# Patient Record
Sex: Male | Born: 1965 | Race: Black or African American | Hispanic: No | Smoking: Current every day smoker
Health system: Southern US, Community
[De-identification: ages and names within clinical notes are randomized; demographics above are authoritative.]

## PROBLEM LIST (undated history)

## (undated) DIAGNOSIS — B019 Varicella without complication: Secondary | ICD-10-CM

## (undated) DIAGNOSIS — T7840XA Allergy, unspecified, initial encounter: Secondary | ICD-10-CM

## (undated) DIAGNOSIS — F32A Depression, unspecified: Secondary | ICD-10-CM

## (undated) DIAGNOSIS — A630 Anogenital (venereal) warts: Secondary | ICD-10-CM

## (undated) DIAGNOSIS — F329 Major depressive disorder, single episode, unspecified: Secondary | ICD-10-CM

## (undated) DIAGNOSIS — K219 Gastro-esophageal reflux disease without esophagitis: Secondary | ICD-10-CM

## (undated) DIAGNOSIS — I10 Essential (primary) hypertension: Secondary | ICD-10-CM

## (undated) HISTORY — DX: Essential (primary) hypertension: I10

## (undated) HISTORY — DX: Anogenital (venereal) warts: A63.0

## (undated) HISTORY — DX: Allergy, unspecified, initial encounter: T78.40XA

## (undated) HISTORY — DX: Major depressive disorder, single episode, unspecified: F32.9

## (undated) HISTORY — DX: Gastro-esophageal reflux disease without esophagitis: K21.9

## (undated) HISTORY — DX: Varicella without complication: B01.9

## (undated) HISTORY — DX: Depression, unspecified: F32.A

---

## 2007-11-11 ENCOUNTER — Ambulatory Visit: Payer: Self-pay | Admitting: Family Medicine

## 2007-11-11 DIAGNOSIS — A630 Anogenital (venereal) warts: Secondary | ICD-10-CM | POA: Insufficient documentation

## 2007-11-11 DIAGNOSIS — J309 Allergic rhinitis, unspecified: Secondary | ICD-10-CM | POA: Insufficient documentation

## 2007-11-11 DIAGNOSIS — F329 Major depressive disorder, single episode, unspecified: Secondary | ICD-10-CM

## 2007-11-11 DIAGNOSIS — B079 Viral wart, unspecified: Secondary | ICD-10-CM | POA: Insufficient documentation

## 2007-11-11 DIAGNOSIS — K219 Gastro-esophageal reflux disease without esophagitis: Secondary | ICD-10-CM

## 2007-11-11 DIAGNOSIS — I10 Essential (primary) hypertension: Secondary | ICD-10-CM | POA: Insufficient documentation

## 2007-12-10 ENCOUNTER — Encounter: Payer: Self-pay | Admitting: Family Medicine

## 2008-05-23 ENCOUNTER — Emergency Department (HOSPITAL_COMMUNITY): Admission: EM | Admit: 2008-05-23 | Discharge: 2008-05-23 | Payer: Self-pay | Admitting: Emergency Medicine

## 2009-07-14 ENCOUNTER — Ambulatory Visit: Payer: Self-pay | Admitting: Family Medicine

## 2009-07-14 DIAGNOSIS — M25569 Pain in unspecified knee: Secondary | ICD-10-CM

## 2009-07-20 ENCOUNTER — Ambulatory Visit (HOSPITAL_COMMUNITY): Admission: RE | Admit: 2009-07-20 | Discharge: 2009-07-20 | Payer: Self-pay | Admitting: Sports Medicine

## 2009-08-24 ENCOUNTER — Ambulatory Visit: Payer: Self-pay | Admitting: Family Medicine

## 2009-08-24 LAB — CONVERTED CEMR LAB
Bilirubin Urine: NEGATIVE
Blood in Urine, dipstick: NEGATIVE
Glucose, Urine, Semiquant: NEGATIVE
Nitrite: NEGATIVE
Urobilinogen, UA: 1
pH: 6

## 2009-08-29 ENCOUNTER — Ambulatory Visit: Payer: Self-pay | Admitting: Family Medicine

## 2009-08-29 DIAGNOSIS — E119 Type 2 diabetes mellitus without complications: Secondary | ICD-10-CM

## 2009-08-29 LAB — CONVERTED CEMR LAB
ALT: 65 units/L — ABNORMAL HIGH (ref 0–53)
Albumin: 4.7 g/dL (ref 3.5–5.2)
Basophils Absolute: 0 10*3/uL (ref 0.0–0.1)
Bilirubin, Direct: 0.2 mg/dL (ref 0.0–0.3)
Calcium: 9.5 mg/dL (ref 8.4–10.5)
Cholesterol: 162 mg/dL (ref 0–200)
Eosinophils Relative: 2.5 % (ref 0.0–5.0)
GFR calc non Af Amer: 114.21 mL/min (ref >60)
HDL: 64.9 mg/dL (ref >39.00)
Hemoglobin: 15.5 g/dL (ref 13.0–17.0)
LDL Cholesterol: 89 mg/dL (ref 0–99)
MCV: 96.2 fL (ref 78.0–100.0)
Monocytes Absolute: 0.7 10*3/uL (ref 0.1–1.0)
Neutrophils Relative %: 66 % (ref 43.0–77.0)
PSA: 0.56 ng/mL (ref 0.10–4.00)
Potassium: 4.9 meq/L (ref 3.5–5.1)
RBC: 4.74 M/uL (ref 4.22–5.81)
RDW: 12.9 % (ref 11.5–14.6)
Triglycerides: 40 mg/dL (ref 0.0–149.0)

## 2010-04-10 NOTE — Assessment & Plan Note (Signed)
Summary: CPX//ALP   Vital Signs:  Patient profile:   45 year old male Weight:      208 pounds BMI:     28.31 BP sitting:   148 / 90  (left arm) Cuff size:   regular  Vitals Entered By: Raechel Ache, RN (August 29, 2009 3:04 PM) CC: CPX, labs done. C/o R knee pain.   History of Present Illness: 45 yr old male for a cpx. He feels good except for right knee pain. He has been seeing Dr. 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 They are planning an arthroscopy soon.   Allergies (verified): No Known Drug Allergies  Past History:  Past Medical History: Reviewed history from 11/11/2007 and no changes required. Depression GERD Genital Warts Allergic rhinitis Hypertension chickenpox  Past Surgical History: Reviewed history from 11/11/2007 and no changes required. Denies surgical history  Family History: Reviewed history from 11/11/2007 and no changes required. Family History of Alcoholism/Addiction Family History of Arthritis Family History Hypertension Family History of Stroke M 1st degree relative <50  Social History: Reviewed history from 11/11/2007 and no changes required. Divorced Current Smoker Alcohol use-yes Drug use-no Regular exercise-yes  Review of Systems  The patient denies anorexia, fever, weight loss, weight gain, vision loss, decreased hearing, hoarseness, chest pain, syncope, dyspnea on exertion, peripheral edema, prolonged cough, headaches, hemoptysis, abdominal pain, melena, hematochezia, severe indigestion/heartburn, hematuria, incontinence, genital sores, muscle weakness, suspicious skin lesions, transient blindness, difficulty walking, depression, unusual weight change, abnormal bleeding, enlarged lymph nodes, angioedema, breast masses, and testicular masses.    Physical Exam  General:  Well-developed,well-nourished,in no acute distress; alert,appropriate and cooperative throughout examination Head:   Normocephalic and atraumatic without obvious abnormalities. No apparent alopecia or balding. Eyes:  No corneal or conjunctival inflammation noted. EOMI. Perrla. Funduscopic exam benign, without hemorrhages, exudates or papilledema. Vision grossly normal. Ears:  External ear exam shows no significant lesions or deformities.  Otoscopic examination reveals clear canals, tympanic membranes are intact bilaterally without bulging, retraction, inflammation or discharge. Hearing is grossly normal bilaterally. Nose:  External nasal examination shows no deformity or inflammation. Nasal mucosa are pink and moist without lesions or exudates. Mouth:  Oral mucosa and oropharynx without lesions or exudates.  Teeth in good repair. Neck:  No deformities, masses, or tenderness noted. Chest Wall:  No deformities, masses, tenderness or gynecomastia noted. Lungs:  Normal respiratory effort, chest expands symmetrically. Lungs are clear to auscultation, no crackles or wheezes. Heart:  Normal rate and regular rhythm. S1 and S2 normal without gallop, murmur, click, rub or other extra sounds. Abdomen:  Bowel sounds positive,abdomen soft and non-tender without masses, organomegaly or hernias noted. Rectal:  No external abnormalities noted. Normal sphincter tone. No rectal masses or tenderness. Genitalia:  Testes bilaterally descended without nodularity, tenderness or masses. No scrotal masses or lesions. No penis lesions or urethral discharge. Prostate:  Prostate gland firm and smooth, no enlargement, nodularity, tenderness, mass, asymmetry or induration. Msk:  No deformity or scoliosis noted of thoracic or lumbar spine.   Pulses:  R and L carotid,radial,femoral,dorsalis pedis and posterior tibial pulses are full and equal bilaterally Extremities:  No clubbing, cyanosis, edema, or deformity noted with normal full range of motion of all joints.   Neurologic:  No cranial nerve deficits noted. Station and gait are normal. Plantar  reflexes are down-going bilaterally. DTRs are symmetrical throughout. Sensory, motor and coordinative functions appear intact. Skin:  Intact without suspicious  lesions or rashes Cervical Nodes:  No lymphadenopathy noted Axillary Nodes:  No palpable lymphadenopathy Inguinal Nodes:  No significant adenopathy Psych:  Cognition and judgment appear intact. Alert and cooperative with normal attention span and concentration. No apparent delusions, illusions, hallucinations   Impression & Recommendations:  Problem # 1:  HEALTH MAINTENANCE EXAM (ICD-V70.0)  Problem # 2:  DIABETES MELLITUS, TYPE II (ICD-250.00)  His updated medication list for this problem includes:    Losartan Potassium-hctz 50-12.5 Mg Tabs (Losartan potassium-hctz) ..... Once daily  Problem # 3:  HYPERTENSION (ICD-401.9)  His updated medication list for this problem includes:    Losartan Potassium-hctz 50-12.5 Mg Tabs (Losartan potassium-hctz) ..... Once daily  Complete Medication List: 1)  Losartan Potassium-hctz 50-12.5 Mg Tabs (Losartan potassium-hctz) .... Once daily  Patient Instructions: 1)  We spent 30 minutes beyond the cpx discussing HTN and DM. I stressed reducing sodium intake and exercise for the BP, and we will start on Losartan HCT. As for diabetes, we will try diet and exercise alone for now. He will reduce his carbohydrate intake, and we will recheck both in 4 weeks.  Prescriptions: LOSARTAN POTASSIUM-HCTZ 50-12.5 MG TABS (LOSARTAN POTASSIUM-HCTZ) once daily  #30 x 11   Entered and Authorized by:   Nelwyn Salisbury MD   Signed by:   Nelwyn Salisbury MD on 08/29/2009   Method used:   Electronically to        [facility] [phone]* (retail)       [address]       [place], [place]  [address]       Ph: [phone]       Fax: [phone]   RxID:   [id number]

## 2010-04-10 NOTE — Assessment & Plan Note (Signed)
Summary: consult re: Knee Pain for over a month/cjr   Vital Signs:  Patient profile:   45 year old male Weight:      202 pounds BMI:     27.50 BP sitting:   150 / 104  (left arm) Cuff size:   regular  Vitals Entered By: Raechel Ache, RN (Jul 14, 2009 11:18 AM) CC: C/o R knee pain x 2 weeks.   History of Present Illness: Here for chronic right knee pain. He originally hurt the knee in high school playing football. This seemed to get better at the time, but over the years he has periodically had trouble with it. he usually gets pain and swellin gin the medial side of the knee for seversal days, then it goes away. However it has bothered him continuously for the past 2 weeks. He cannot sleep due ti the pain, and it does not respond to Ibuprofen. No locking or giving way.   Allergies (verified): No Known Drug Allergies  Past History:  Past Medical History: Reviewed history from 11/11/2007 and no changes required. Depression GERD Genital Warts Allergic rhinitis Hypertension chickenpox  Past Surgical History: Reviewed history from 11/11/2007 and no changes required. Denies surgical history  Review of Systems  The patient denies anorexia, fever, weight loss, weight gain, vision loss, decreased hearing, hoarseness, chest pain, syncope, dyspnea on exertion, peripheral edema, prolonged cough, headaches, hemoptysis, abdominal pain, melena, hematochezia, severe indigestion/heartburn, hematuria, incontinence, genital sores, muscle weakness, suspicious skin lesions, transient blindness, difficulty walking, depression, unusual weight change, abnormal bleeding, enlarged lymph nodes, angioedema, breast masses, and testicular masses.    Physical Exam  General:  walks with a limp Msk:  the right knee is tender in the medial joint space. Full ROM although flexion is painful. No swelling or crepitus. Negative McMurrays and negative anterior drawer   Impression & Recommendations:  Problem  # 1:  KNEE PAIN (ICD-719.46)  His updated medication list for this problem includes:    Vicodin Hp 10-660 Mg Tabs (Hydrocodone-acetaminophen) .Marland Kitchen... 1 q 6 hours as needed pain  Orders: Orthopedic Referral (Ortho)  Complete Medication List: 1)  Vicodin Hp 10-660 Mg Tabs (Hydrocodone-acetaminophen) .Marland Kitchen.. 1 q 6 hours as needed pain  Patient Instructions: 1)  I think it likely that he has torn some cartilage in the knee, so we will refer to Orthopedics. Prescriptions: VICODIN HP 10-660 MG TABS (HYDROCODONE-ACETAMINOPHEN) 1 q 6 hours as needed pain  #30 x 0   Entered and Authorized by:   Nelwyn Salisbury MD   Signed by:   Nelwyn Salisbury MD on 07/14/2009   Method used:   Print then Give to Patient   RxID:   [id number]

## 2010-06-26 ENCOUNTER — Encounter: Payer: Self-pay | Admitting: Family Medicine

## 2010-06-26 ENCOUNTER — Ambulatory Visit: Payer: Self-pay | Admitting: Family Medicine

## 2010-06-27 ENCOUNTER — Ambulatory Visit (INDEPENDENT_AMBULATORY_CARE_PROVIDER_SITE_OTHER): Payer: Managed Care, Other (non HMO) | Admitting: Family Medicine

## 2010-06-27 ENCOUNTER — Encounter: Payer: Self-pay | Admitting: Family Medicine

## 2010-06-27 VITALS — BP 130/100 | HR 68 | Temp 98.3°F | Wt 219.0 lb

## 2010-06-27 DIAGNOSIS — G5603 Carpal tunnel syndrome, bilateral upper limbs: Secondary | ICD-10-CM

## 2010-06-27 DIAGNOSIS — M7710 Lateral epicondylitis, unspecified elbow: Secondary | ICD-10-CM

## 2010-06-27 DIAGNOSIS — M7552 Bursitis of left shoulder: Secondary | ICD-10-CM

## 2010-06-27 DIAGNOSIS — G5600 Carpal tunnel syndrome, unspecified upper limb: Secondary | ICD-10-CM

## 2010-06-27 DIAGNOSIS — M67919 Unspecified disorder of synovium and tendon, unspecified shoulder: Secondary | ICD-10-CM

## 2010-06-27 DIAGNOSIS — M719 Bursopathy, unspecified: Secondary | ICD-10-CM

## 2010-06-27 DIAGNOSIS — I10 Essential (primary) hypertension: Secondary | ICD-10-CM

## 2010-06-27 MED ORDER — LOSARTAN POTASSIUM-HCTZ 50-12.5 MG PO TABS: 1.0000 | ORAL_TABLET | Freq: Every day | ORAL | Status: DC

## 2010-06-27 MED ORDER — ETODOLAC 500 MG PO TABS: 500.0000 mg | ORAL_TABLET | Freq: Two times a day (BID) | ORAL | Status: AC

## 2010-06-27 NOTE — Progress Notes (Signed)
  Subjective:    Patient ID: Maurice Sweeney, male    DOB: 30-Sep-1965, 45 y.o.   MRN: [id number]  HPI Here for several different pains. For the past 3 months he has had occasional numbness and tingling and weakness in both hands and forearms, the right worse than the left. Now he occasionally gets pain in the right hand also. Second, he describes pains in both elbows which gets worse the more he uses the. Third he has had pain in the left shoulder for a month or so. No trauma hx, but his job is quite physical. 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 He admits to not taking his BP med for several months because he "forgot".    Review of Systems  Musculoskeletal: Positive for arthralgias. Negative for back pain.  Neurological: Positive for weakness and numbness.       Objective:   Physical Exam  Constitutional: He appears well-developed and well-nourished.  Cardiovascular: Normal rate, regular rhythm, normal heart sounds and intact distal pulses.   Pulmonary/Chest: Effort normal and breath sounds normal.  Musculoskeletal:       He has full ROM of the neck and spine, no neck tenderness. He is very tender over both lateral epicondyles at the elbows. He is tender over the right forearm and wrist. No swelling.   Neurological: He has normal reflexes. He exhibits normal muscle tone.          Assessment & Plan:  He has 3 different overuse syndromes going on. I wrote a note for him to avoid heavy lifting at work for the next 2 weeks. He is to avoid any weight lifting or yard work on his spare time for now. Rest, ice packs, Etodolac for inflammation. Get back on HTN meds.

## 2010-07-23 ENCOUNTER — Encounter: Payer: Self-pay | Admitting: Family Medicine

## 2010-07-23 ENCOUNTER — Ambulatory Visit (INDEPENDENT_AMBULATORY_CARE_PROVIDER_SITE_OTHER): Payer: Managed Care, Other (non HMO) | Admitting: Family Medicine

## 2010-07-23 VITALS — BP 130/100 | Temp 98.5°F | Ht 72.0 in | Wt 221.0 lb

## 2010-07-23 DIAGNOSIS — M79609 Pain in unspecified limb: Secondary | ICD-10-CM

## 2010-07-23 DIAGNOSIS — M79643 Pain in unspecified hand: Secondary | ICD-10-CM

## 2010-07-23 DIAGNOSIS — M79603 Pain in arm, unspecified: Secondary | ICD-10-CM

## 2010-07-23 NOTE — Progress Notes (Signed)
  Subjective:    Patient ID: Maurice Sweeney, male    DOB: [birth year], 45 y.o.   MRN: [id number]  HPI Her with continued problems of intermittent pain and numbness and weakness in both arms from the shoulders down to the hands. This has go on for the past 4 months. No hx of trauma. No neck or back pain. He has been taking Etodolac regularly with no improvement.    Review of Systems  Musculoskeletal: Positive for myalgias. Negative for back pain and arthralgias.  Neurological: Positive for weakness and numbness.       Objective:   Physical Exam  Constitutional: He is oriented to person, place, and time. He appears well-developed and well-nourished.  Neck: Normal range of motion. Neck supple.       No tenderness or spasm  Musculoskeletal: Normal range of motion. He exhibits no edema.       He has pain when shaking either hand.   Neurological: He is alert and oriented to person, place, and time. He displays normal reflexes. No cranial nerve deficit. He exhibits normal muscle tone. Coordination normal.          Assessment & Plan:  This is either due to carpal tunnel syndrome or to pinched nerves in the neck, most likely the former. Suggested he wear wrist braces as much as possible. Set up NCSs soon with EMGs

## 2010-08-17 ENCOUNTER — Telehealth: Payer: Self-pay | Admitting: Family Medicine

## 2010-08-17 NOTE — Telephone Encounter (Signed)
Tell him his nerve conduction study confirms what I thought, that he has carpal tunnel syndrome in the right hand. Ask him if wearing the wrist braces has helped. If so, keep doing this. If not, I will refer him to a hand surgeon to consider surgery

## 2010-08-17 NOTE — Telephone Encounter (Signed)
Left message on machine for patient to return our call 

## 2010-08-20 ENCOUNTER — Telehealth: Payer: Self-pay | Admitting: *Deleted

## 2010-08-20 DIAGNOSIS — G5600 Carpal tunnel syndrome, unspecified upper limb: Secondary | ICD-10-CM

## 2010-08-20 NOTE — Telephone Encounter (Signed)
Tell him his nerve conduction study confirms what I thought, that he has carpal tunnel syndrome in the right hand. Ask him if wearing the wrist braces has helped. If so, keep doing this. If not, I will refer him to a hand surgeon to consider surgery   Left message on machine to call back.

## 2010-08-20 NOTE — Telephone Encounter (Signed)
Pt aware of results and the brace is not helping. Order sent to [facility] for hand surgery referral.

## 2010-08-23 ENCOUNTER — Encounter: Payer: Self-pay | Admitting: Family Medicine

## 2010-10-05 ENCOUNTER — Encounter (HOSPITAL_BASED_OUTPATIENT_CLINIC_OR_DEPARTMENT_OTHER)
Admission: RE | Admit: 2010-10-05 | Discharge: 2010-10-05 | Disposition: A | Discharge status: Home / Self Care | Payer: Managed Care, Other (non HMO) | Source: Ambulatory Visit | Attending: Orthopedic Surgery | Admitting: Orthopedic Surgery

## 2010-10-05 LAB — BASIC METABOLIC PANEL
BUN: 16 mg/dL (ref 6–23)
Calcium: 9.7 mg/dL (ref 8.4–10.5)
Chloride: 104 mEq/L (ref 96–112)
Potassium: 4.4 mEq/L (ref 3.5–5.1)
Sodium: 137 mEq/L (ref 135–145)

## 2010-10-09 ENCOUNTER — Ambulatory Visit (HOSPITAL_BASED_OUTPATIENT_CLINIC_OR_DEPARTMENT_OTHER)
Admission: RE | Admit: 2010-10-09 | Discharge: 2010-10-09 | Disposition: A | Discharge status: Home / Self Care | Payer: Managed Care, Other (non HMO) | Source: Ambulatory Visit | Attending: Orthopedic Surgery | Admitting: Orthopedic Surgery

## 2010-10-09 DIAGNOSIS — G5600 Carpal tunnel syndrome, unspecified upper limb: Secondary | ICD-10-CM | POA: Insufficient documentation

## 2010-10-09 DIAGNOSIS — Z01810 Encounter for preprocedural cardiovascular examination: Secondary | ICD-10-CM | POA: Insufficient documentation

## 2010-10-09 DIAGNOSIS — Z01812 Encounter for preprocedural laboratory examination: Secondary | ICD-10-CM | POA: Insufficient documentation

## 2010-12-11 NOTE — Op Note (Signed)
  NAME:  Maurice Sweeney, Maurice Sweeney                ACCOUNT NO.:  [id number]  MEDICAL RECORD NO.:  [id number]  LOCATION:                                 FACILITY:  PHYSICIAN:  Cindee Salt, M.D.       DATE OF BIRTH:  [birth year]  DATE OF PROCEDURE:  10/09/2010 DATE OF DISCHARGE:                              OPERATIVE REPORT   PREOPERATIVE DIAGNOSIS:  Carpal tunnel syndrome, right hand.  POSTOPERATIVE DIAGNOSIS:  Carpal tunnel syndrome, right hand.  OPERATION:  Decompression of right median nerve.  SURGEON:  Cindee Salt, MD  ANESTHESIA:  General with local infiltration.  ANESTHESIOLOGIST:  Janetta Hora. Gelene Mink, MD  HISTORY:  The patient is a 45 year old male with a history of carpal tunnel syndrome, EMG nerve conductions positive.  This has not responded to conservative treatment.  He has elected to undergo surgical decompression.  Pre, peri, and postoperative course have been discussed along with risks and complications.  He is aware there is no guaranteewith the surgery, possibility of infection, recurrence, injury to arteries, nerves, tendons, complete relief of symptoms, dystrophy.  In the preoperative area, the patient was seen, the extremity was marked by both the patient and surgeon, antibiotic given.  PROCEDURE:  The patient was brought to the operating room.  Where general anesthetic was carried out without difficulty.  He was prepped using ChloraPrep in supine position, right arm free.  A 3-minute dry time was allowed.  Time-out taken confirming the patient and the procedure.  The limb was exsanguinated with an Esmarch bandage. Tourniquet was placed on forearm and was inflated to 220 mmHg.  A straight incision was made longitudinally on the palm, carried down through subcutaneous tissue.  Bleeders were electrocauterized, palmar fascia was split, superficial palmar arch identified, the flexor tendon to the ring and little fingers was identified.  To the ulnar side of median nerve,  the carpal retinaculum was incised with sharp dissection. Right angle and Sewall retractor were placed between skin and forearm fascia.  The fascia was released for approximately 1.5 proximal to the wrist crease under direct vision.  The area of compression to the nerve with the nerve hyperemia was immediately apparent.  No further lesions were identified.  Tenosynovium tissue was markedly thickened.  The wound was irrigated.  The skin was closed with interrupted 4-0 Vicryl Rapide sutures.  A local infiltration with 0.25% Marcaine without epinephrine was given.  A sterile compressive dressing was applied and fingers were free.  On deflation of the tourniquet, all fingers immediately pinked.  He was taken to the recovery room for observation in satisfactory condition.  He will be discharged home to return to the [facility] [place] in 1 week on Vicodin.          ______________________________ Cindee Salt, M.D.     GK/MEDQ  D:  10/09/2010  T:  10/09/2010  Job:  [id number]  cc:   Jeannett Senior A. Clent Ridges, MD  Electronically Signed by Cindee Salt M.D. on 12/11/2010 04:36:53 PM

## 2011-02-06 IMAGING — CR DG ORBITS FOR FOREIGN BODY
2 series · 2 of 2 positions shown · non-contrast
Comparison: None.

CLINICAL DATA: MRI screening.  History of working with metal.

ORBITS FOR FOREIGN BODY - 2 VIEW

[view not recorded (1 of 2)]
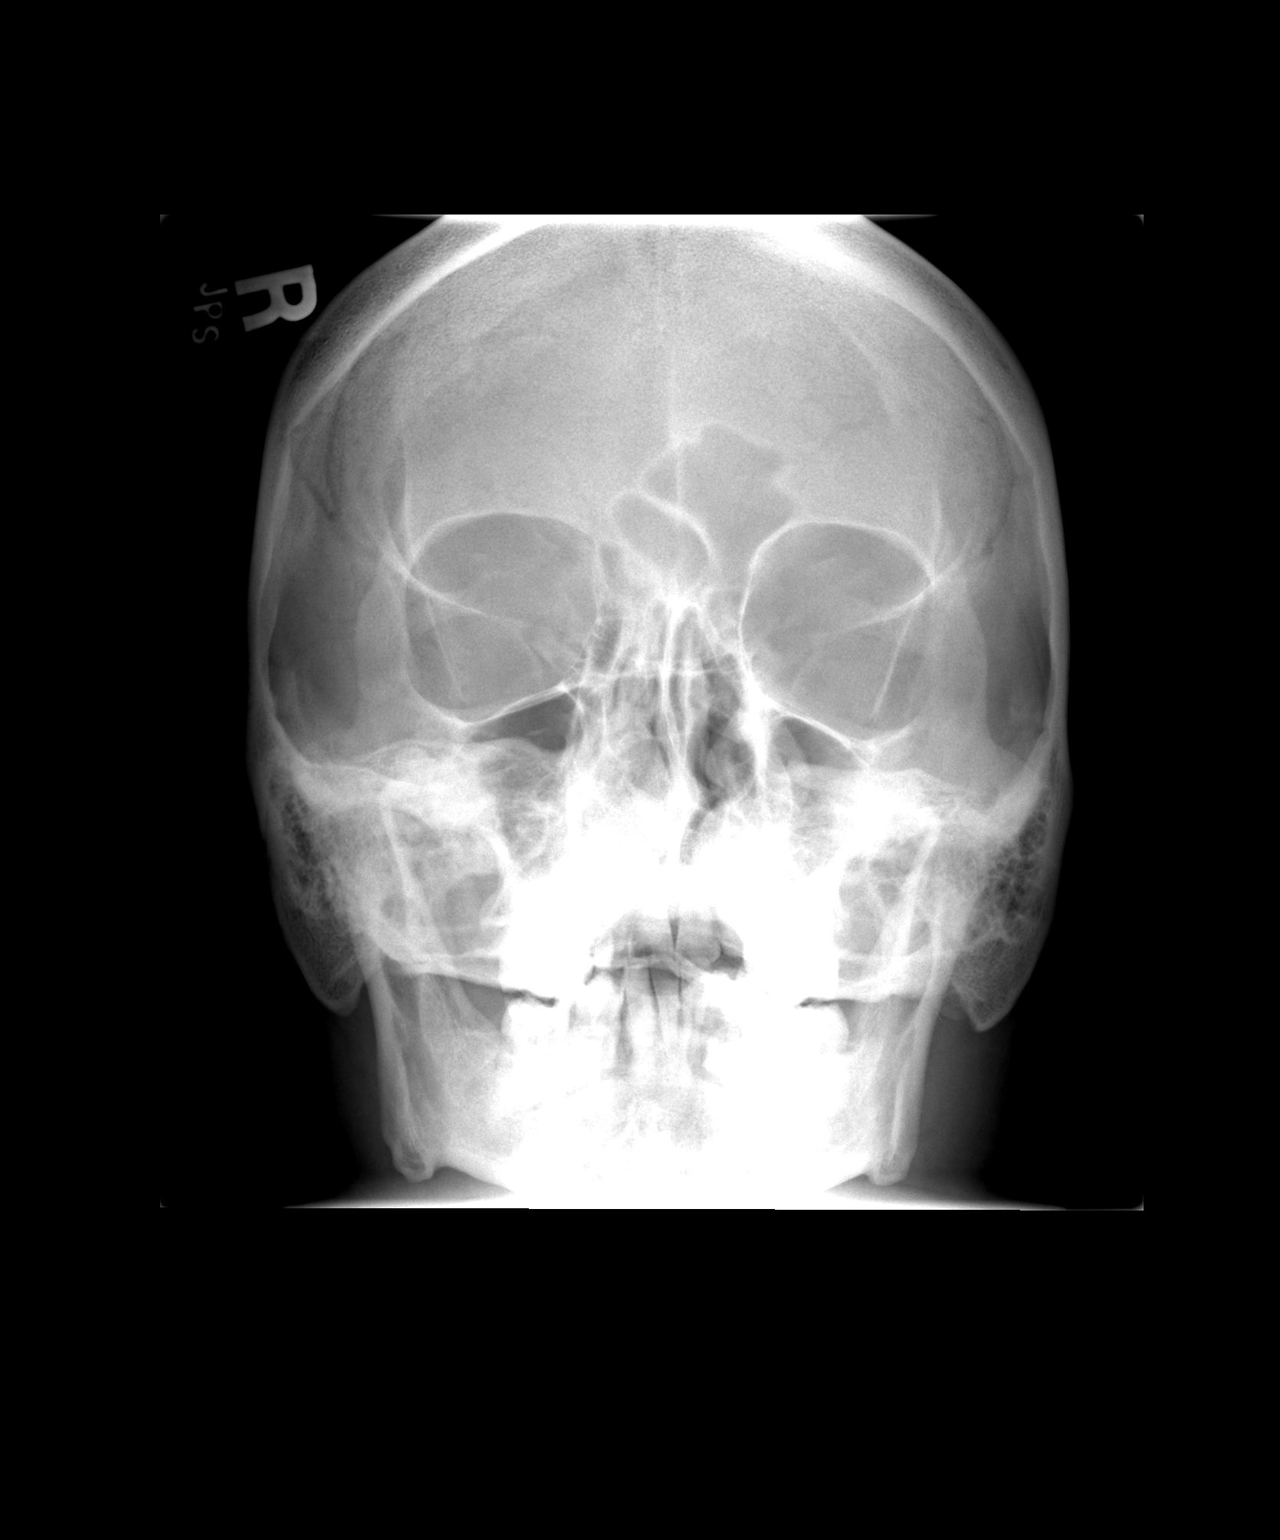

[view not recorded (2 of 2)]
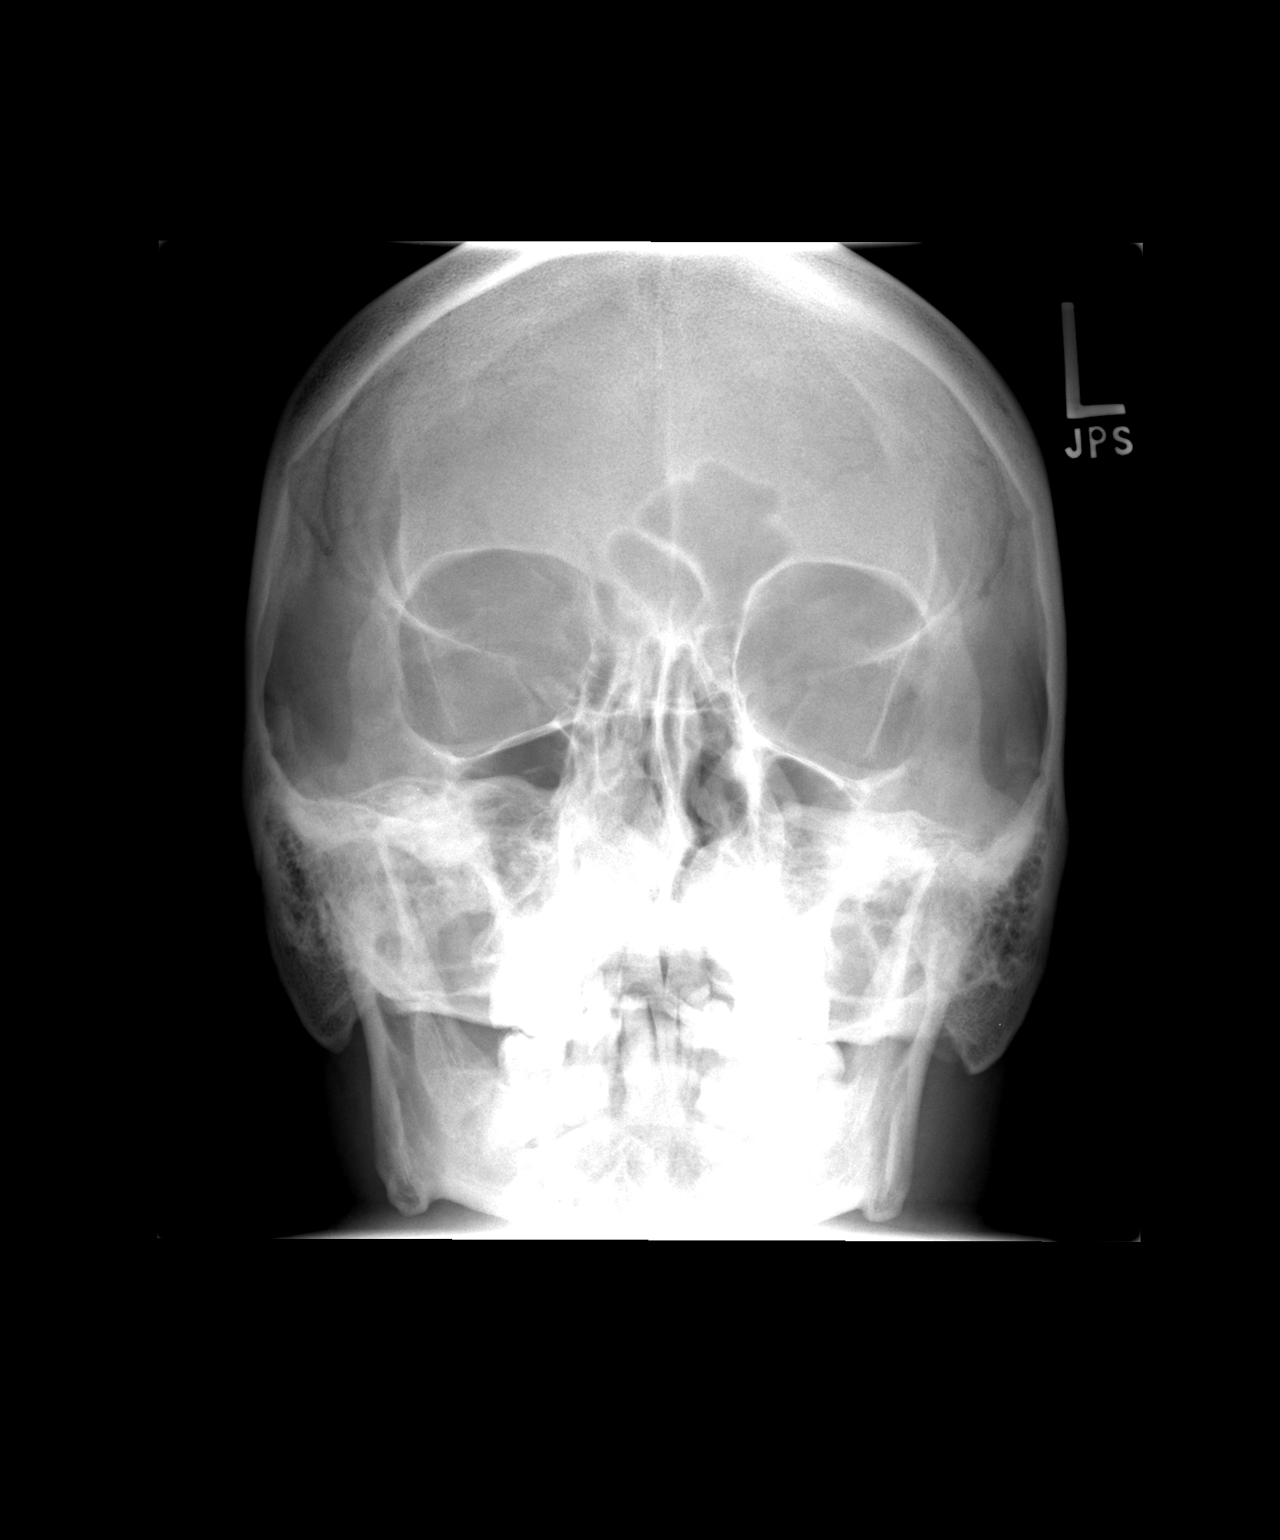

[2 of 2 positions shown; findings below may reference images not displayed]

FINDINGS: No metallic orbital foreign body is appreciated.
IMPRESSION: No evidence for metallic orbital foreign material.

## 2012-05-12 ENCOUNTER — Encounter: Payer: Self-pay | Admitting: Family Medicine

## 2012-05-12 ENCOUNTER — Ambulatory Visit (INDEPENDENT_AMBULATORY_CARE_PROVIDER_SITE_OTHER): Payer: Managed Care, Other (non HMO) | Admitting: Family Medicine

## 2012-05-12 VITALS — BP 164/108 | HR 82 | Temp 98.5°F

## 2012-05-12 DIAGNOSIS — I10 Essential (primary) hypertension: Secondary | ICD-10-CM

## 2012-05-12 DIAGNOSIS — N529 Male erectile dysfunction, unspecified: Secondary | ICD-10-CM

## 2012-05-12 LAB — HEPATIC FUNCTION PANEL
Bilirubin, Direct: 0.1 mg/dL (ref 0.0–0.3)
Total Bilirubin: 0.7 mg/dL (ref 0.3–1.2)

## 2012-05-12 LAB — CBC WITH DIFFERENTIAL/PLATELET
Eosinophils Absolute: 0.2 10*3/uL (ref 0.0–0.7)
Hemoglobin: 15.4 g/dL (ref 13.0–17.0)
Monocytes Relative: 7.1 % (ref 3.0–12.0)
Neutro Abs: 4.9 10*3/uL (ref 1.4–7.7)
RBC: 5 Mil/uL (ref 4.22–5.81)

## 2012-05-12 LAB — BASIC METABOLIC PANEL
CO2: 29 mEq/L (ref 19–32)
Creatinine, Ser: 0.9 mg/dL (ref 0.4–1.5)
GFR: 123.45 mL/min (ref >60.00)
Sodium: 139 mEq/L (ref 135–145)

## 2012-05-12 LAB — TSH: TSH: 0.63 u[IU]/mL (ref 0.35–5.50)

## 2012-05-12 MED ORDER — SILDENAFIL CITRATE 50 MG PO TABS: 100.0000 mg | ORAL_TABLET | Freq: Every day | ORAL | Status: DC | PRN

## 2012-05-12 MED ORDER — LOSARTAN POTASSIUM-HCTZ 100-25 MG PO TABS: 1.0000 | ORAL_TABLET | Freq: Every day | ORAL | Status: DC

## 2012-05-12 NOTE — Progress Notes (Signed)
  Subjective:    Patient ID: Maurice Sweeney, male    DOB: 1965-07-17, 47 y.o.   MRN: [id number]  HPI Here to discuss several issues. We have not seen him for 2 years because he had lost his medical insurance for awhile. Now he has it back. He stopped taking Hyzaar about a year ago, and of course his BP has been high. He denies any HAs or chest pain or SOB. Also he has developed some erection problems in the past year. His desire levels are still good. He denies any mood problems.    Review of Systems  Constitutional: Negative.   Respiratory: Negative.   Cardiovascular: Negative.   Neurological: Negative.   Psychiatric/Behavioral: Negative.        Objective:   Physical Exam  Constitutional: He appears well-developed and well-nourished.  Neck: Neck supple. No thyromegaly present.  Cardiovascular: Normal rate, regular rhythm, normal heart sounds and intact distal pulses.   Pulmonary/Chest: Effort normal and breath sounds normal.  Lymphadenopathy:    He has no cervical adenopathy.          Assessment & Plan:  We will treat the HTN with Hyzaar again but will increase the dose to 100/25 daily. His ED probably has several etiologies including his uncontrolled BP and his smoking. He still smokes 1/2 ppd. I strongly urged him to quit. We will get labs to day including a testosterone level  to rule out metabolic issues. Gave him samples of Viagra to try. He will return in one month to reassess these issues and for a cpx.

## 2012-05-13 LAB — POCT URINALYSIS DIPSTICK
Bilirubin, UA: NEGATIVE
Blood, UA: NEGATIVE
Ketones, UA: NEGATIVE
Leukocytes, UA: NEGATIVE
Protein, UA: NEGATIVE
Spec Grav, UA: 1.02
pH, UA: 6.5

## 2012-05-18 MED ORDER — TESTOSTERONE 20.25 MG/1.25GM (1.62%) TD GEL: 4.0000 "application " | Freq: Every day | TRANSDERMAL | Status: DC

## 2012-05-18 NOTE — Addendum Note (Signed)
Addended by: Aniceto Boss A on: 05/18/2012 10:18 AM   Modules accepted: Orders

## 2012-05-18 NOTE — Progress Notes (Signed)
Quick Note:  I left voice message for pt with results, called in script to [facility] and put a copy of results in mail to pt. ______

## 2012-05-18 NOTE — Progress Notes (Signed)
Quick Note:  I was going to put in mail, however there was no street address number, on the name of street. ______

## 2012-06-30 ENCOUNTER — Encounter: Payer: Self-pay | Admitting: Family Medicine

## 2012-06-30 ENCOUNTER — Ambulatory Visit (INDEPENDENT_AMBULATORY_CARE_PROVIDER_SITE_OTHER): Payer: Managed Care, Other (non HMO) | Admitting: Family Medicine

## 2012-06-30 VITALS — BP 146/88 | HR 67 | Temp 98.0°F | Ht 71.25 in | Wt 204.0 lb

## 2012-06-30 DIAGNOSIS — Z0000 Encounter for general adult medical examination without abnormal findings: Secondary | ICD-10-CM

## 2012-06-30 MED ORDER — VERAPAMIL HCL ER 120 MG PO TBCR: 120.0000 mg | EXTENDED_RELEASE_TABLET | Freq: Every day | ORAL | Status: DC

## 2012-06-30 MED ORDER — SILDENAFIL CITRATE 100 MG PO TABS: 100.0000 mg | ORAL_TABLET | Freq: Every day | ORAL | Status: DC | PRN

## 2012-06-30 NOTE — Progress Notes (Signed)
Quick Note:  I left voice message with results. ______ 

## 2012-06-30 NOTE — Progress Notes (Signed)
  Subjective:    Patient ID: Maurice Sweeney, male    DOB: 14-Jun-1965, 47 y.o.   MRN: [id number]  HPI 47 yr old male for a cpx. He has one complaint today. For several years he has had pain in the tips of his fingers whenever his hands get cold. He works in [employer] at times on his job, and wearing gloves does not help. Then when his hands warm up the pain goes away. The fingers do not change colors. He had a borderline low testosterone level but he cannot afford to buy any replacement products. He tried Viagra and it helps.    Review of Systems  Constitutional: Negative.   HENT: Negative.   Eyes: Negative.   Respiratory: Negative.   Cardiovascular: Negative.   Gastrointestinal: Negative.   Genitourinary: Negative.   Musculoskeletal: Negative.   Skin: Negative.   Neurological: Negative.   Psychiatric/Behavioral: Negative.        Objective:   Physical Exam  Constitutional: He is oriented to person, place, and time. He appears well-developed and well-nourished. No distress.  HENT:  Head: Normocephalic and atraumatic.  Right Ear: External ear normal.  Left Ear: External ear normal.  Nose: Nose normal.  Mouth/Throat: Oropharynx is clear and moist. No oropharyngeal exudate.  Eyes: Conjunctivae and EOM are normal. Pupils are equal, round, and reactive to light. Right eye exhibits no discharge. Left eye exhibits no discharge. No scleral icterus.  Neck: Neck supple. No JVD present. No tracheal deviation present. No thyromegaly present.  Cardiovascular: Normal rate, regular rhythm, normal heart sounds and intact distal pulses.  Exam reveals no gallop and no friction rub.   No murmur heard. Pulmonary/Chest: Effort normal and breath sounds normal. No respiratory distress. He has no wheezes. He has no rales. He exhibits no tenderness.  Abdominal: Soft. Bowel sounds are normal. He exhibits no distension and no mass. There is no tenderness. There is no rebound and no guarding.  Genitourinary:  Rectum normal, prostate normal and penis normal. Guaiac negative stool. No penile tenderness.  Musculoskeletal: Normal range of motion. He exhibits no edema and no tenderness.  Lymphadenopathy:    He has no cervical adenopathy.  Neurological: He is alert and oriented to person, place, and time. He has normal reflexes. No cranial nerve deficit. He exhibits normal muscle tone. Coordination normal.  Skin: Skin is warm and dry. No rash noted. He is not diaphoretic. No erythema. No pallor.  Psychiatric: He has a normal mood and affect. His behavior is normal. Judgment and thought content normal.          Assessment & Plan:  Well exam. Get a PSA level. He will use Viagra prn. 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 Recheck in one month.

## 2012-07-13 ENCOUNTER — Ambulatory Visit (INDEPENDENT_AMBULATORY_CARE_PROVIDER_SITE_OTHER): Payer: Managed Care, Other (non HMO) | Admitting: Family Medicine

## 2012-07-13 ENCOUNTER — Encounter: Payer: Self-pay | Admitting: Family Medicine

## 2012-07-13 DIAGNOSIS — L03019 Cellulitis of unspecified finger: Secondary | ICD-10-CM

## 2012-07-13 DIAGNOSIS — L02519 Cutaneous abscess of unspecified hand: Secondary | ICD-10-CM

## 2012-07-13 MED ORDER — CEPHALEXIN 500 MG PO CAPS: 500.0000 mg | ORAL_CAPSULE | Freq: Four times a day (QID) | ORAL | Status: DC

## 2012-07-13 NOTE — Progress Notes (Signed)
  Subjective:    Patient ID: Maurice Sweeney, male    DOB: [birth year], 47 y.o.   MRN: [id number]  HPI Here for 3 days of swelling and pain around the tip of the right third finger. No recent trauma. He had to leave work today because he cannot grip his tools safely. He had been using a hammer to put down wooden floors.    Review of Systems  Constitutional: Negative.        Objective:   Physical Exam  Constitutional: He appears well-developed and well-nourished. No distress.  Musculoskeletal: Normal range of motion. He exhibits no edema.  The distal third of the right third finger is swollen and quite tender. Not red or warm. No breaks in the skin.   Skin: No rash noted. No erythema.          Assessment & Plan:  Use ice packs and Keflex. Out of work today and tomorrow .

## 2012-07-29 ENCOUNTER — Ambulatory Visit: Payer: Managed Care, Other (non HMO) | Admitting: Family Medicine

## 2012-12-21 ENCOUNTER — Ambulatory Visit (INDEPENDENT_AMBULATORY_CARE_PROVIDER_SITE_OTHER): Payer: Managed Care, Other (non HMO) | Admitting: Family Medicine

## 2012-12-21 ENCOUNTER — Encounter: Payer: Self-pay | Admitting: Family Medicine

## 2012-12-21 VITALS — BP 140/80 | HR 67 | Temp 97.9°F | Wt 207.0 lb

## 2012-12-21 DIAGNOSIS — N410 Acute prostatitis: Secondary | ICD-10-CM

## 2012-12-21 LAB — POCT URINALYSIS DIPSTICK
Bilirubin, UA: NEGATIVE
Ketones, UA: NEGATIVE
Leukocytes, UA: NEGATIVE
Nitrite, UA: NEGATIVE
Spec Grav, UA: >=1.03

## 2012-12-21 MED ORDER — CIPROFLOXACIN HCL 500 MG PO TABS: 500.0000 mg | ORAL_TABLET | Freq: Two times a day (BID) | ORAL | Status: DC

## 2012-12-21 NOTE — Progress Notes (Signed)
  Subjective:    Patient ID: Maurice Sweeney, male    DOB: 1965/10/22, 47 y.o.   MRN: [id number]  HPI Here for 8 days of LLQ and suprapubic pains. He has also had increased urgency to urinate but he has a slow stream and he feels like he is not emptying his bladder. He has been constipated but has had some BMs after taking MOM. His last BM was this am. No fever or nausea.    Review of Systems  Constitutional: Negative.   Gastrointestinal: Positive for abdominal pain and constipation. Negative for nausea, vomiting, diarrhea, blood in stool, abdominal distention, anal bleeding and rectal pain.  Genitourinary: Positive for frequency and difficulty urinating. Negative for dysuria, hematuria, flank pain and testicular pain.       Objective:   Physical Exam  Constitutional: He appears well-developed and well-nourished. No distress.  Abdominal: Soft. Bowel sounds are normal. He exhibits no distension and no mass. There is no rebound and no guarding.  Tender in the LLQ and above the pubis  Genitourinary:  The prostate is mildly swollen and extremely tender           Assessment & Plan:  Drink lots of water. Take Cipro for 14 days. Out of work today and tomorrow

## 2012-12-21 NOTE — Addendum Note (Signed)
Addended by: Aniceto Boss A on: 12/21/2012 12:12 PM   Modules accepted: Orders

## 2013-01-04 ENCOUNTER — Encounter: Payer: Self-pay | Admitting: Family Medicine

## 2013-01-04 ENCOUNTER — Ambulatory Visit (INDEPENDENT_AMBULATORY_CARE_PROVIDER_SITE_OTHER): Payer: Managed Care, Other (non HMO) | Admitting: Family Medicine

## 2013-01-04 VITALS — BP 140/82 | Temp 98.1°F | Wt 208.0 lb

## 2013-01-04 DIAGNOSIS — N419 Inflammatory disease of prostate, unspecified: Secondary | ICD-10-CM

## 2013-01-04 MED ORDER — SULFAMETHOXAZOLE-TMP DS 800-160 MG PO TABS: 1.0000 | ORAL_TABLET | Freq: Two times a day (BID) | ORAL | Status: DC

## 2013-01-04 NOTE — Progress Notes (Signed)
  Subjective:    Patient ID: Maurice Sweeney, male    DOB: 04-06-1965, 47 y.o.   MRN: [id number]  HPI Here for a recurrence of prostatic symptoms. He was here 2 weeks ago and was given a 14 day course of Cipro which he just finished. He felt much better for a week or so but then the sx returned. He has urgency to urinate with a very slow stream. He has occasional lower abdominal pains or rectal pains. No fever.    Review of Systems  Constitutional: Negative.   Gastrointestinal: Positive for abdominal pain and rectal pain. Negative for nausea, vomiting, diarrhea, constipation, blood in stool and abdominal distention.  Genitourinary: Positive for dysuria, urgency and difficulty urinating. Negative for frequency, flank pain, discharge and enuresis.       Objective:   Physical Exam  Constitutional: He appears well-developed and well-nourished.  Abdominal: Soft. Bowel sounds are normal. He exhibits no distension. There is no tenderness. There is no rebound and no guarding.          Assessment & Plan:  Partially treated prostatitis. Use Bactrim DS for 14 days

## 2013-09-17 ENCOUNTER — Telehealth: Payer: Self-pay | Admitting: Family Medicine

## 2013-09-17 DIAGNOSIS — E119 Type 2 diabetes mellitus without complications: Secondary | ICD-10-CM

## 2013-09-17 NOTE — Telephone Encounter (Signed)
Left our callback on pt cell. Pt cell unable to leave a message. Pt needs diabetic bundle a1c and ldl and follow up appt with md for bp

## 2013-09-30 ENCOUNTER — Encounter: Payer: Self-pay | Admitting: *Deleted

## 2013-09-30 NOTE — Telephone Encounter (Signed)
Letter sent Lipid, a1c, bmet, micro albumin ordered Diabetic bundle

## 2013-10-15 ENCOUNTER — Telehealth: Payer: Self-pay | Admitting: *Deleted

## 2013-10-15 NOTE — Telephone Encounter (Signed)
Appointment made for diabetic bundle

## 2013-10-18 ENCOUNTER — Encounter: Payer: Self-pay | Admitting: Family Medicine

## 2013-10-18 ENCOUNTER — Telehealth: Payer: Self-pay | Admitting: Family Medicine

## 2013-10-18 ENCOUNTER — Ambulatory Visit (INDEPENDENT_AMBULATORY_CARE_PROVIDER_SITE_OTHER): Payer: Managed Care, Other (non HMO) | Admitting: Family Medicine

## 2013-10-18 VITALS — BP 157/98 | HR 63 | Temp 98.2°F | Ht 71.25 in | Wt 220.0 lb

## 2013-10-18 DIAGNOSIS — I10 Essential (primary) hypertension: Secondary | ICD-10-CM

## 2013-10-18 DIAGNOSIS — E119 Type 2 diabetes mellitus without complications: Secondary | ICD-10-CM

## 2013-10-18 DIAGNOSIS — N401 Enlarged prostate with lower urinary tract symptoms: Secondary | ICD-10-CM

## 2013-10-18 DIAGNOSIS — N139 Obstructive and reflux uropathy, unspecified: Secondary | ICD-10-CM

## 2013-10-18 DIAGNOSIS — N138 Other obstructive and reflux uropathy: Secondary | ICD-10-CM

## 2013-10-18 DIAGNOSIS — L989 Disorder of the skin and subcutaneous tissue, unspecified: Secondary | ICD-10-CM

## 2013-10-18 LAB — LIPID PANEL
CHOL/HDL RATIO: 3
CHOLESTEROL: 152 mg/dL (ref 0–200)
HDL: 59.7 mg/dL (ref >39.00)
LDL CALC: 82 mg/dL (ref 0–99)
NONHDL: 92.3
TRIGLYCERIDES: 51 mg/dL (ref 0.0–149.0)
VLDL: 10.2 mg/dL (ref 0.0–40.0)

## 2013-10-18 LAB — POCT URINALYSIS DIPSTICK
Bilirubin, UA: NEGATIVE
Blood, UA: NEGATIVE
GLUCOSE UA: NEGATIVE
KETONES UA: NEGATIVE
LEUKOCYTES UA: NEGATIVE
Nitrite, UA: NEGATIVE
Protein, UA: NEGATIVE
Spec Grav, UA: 1.015
Urobilinogen, UA: 1
pH, UA: 7.5

## 2013-10-18 LAB — CBC WITH DIFFERENTIAL/PLATELET
BASOS ABS: 0 10*3/uL (ref 0.0–0.1)
BASOS PCT: 0.3 % (ref 0.0–3.0)
EOS ABS: 0.2 10*3/uL (ref 0.0–0.7)
Eosinophils Relative: 2.5 % (ref 0.0–5.0)
HEMATOCRIT: 46 % (ref 39.0–52.0)
HEMOGLOBIN: 15.4 g/dL (ref 13.0–17.0)
LYMPHS ABS: 2.2 10*3/uL (ref 0.7–4.0)
LYMPHS PCT: 22.9 % (ref 12.0–46.0)
MCHC: 33.4 g/dL (ref 30.0–36.0)
MCV: 93.4 fl (ref 78.0–100.0)
MONO ABS: 0.6 10*3/uL (ref 0.1–1.0)
Monocytes Relative: 6.3 % (ref 3.0–12.0)
NEUTROS PCT: 68 % (ref 43.0–77.0)
Neutro Abs: 6.5 10*3/uL (ref 1.4–7.7)
PLATELETS: 214 10*3/uL (ref 150.0–400.0)
RBC: 4.93 Mil/uL (ref 4.22–5.81)
RDW: 12.6 % (ref 11.5–15.5)
WBC: 9.6 10*3/uL (ref 4.0–10.5)

## 2013-10-18 LAB — MICROALBUMIN / CREATININE URINE RATIO
CREATININE, U: 171.5 mg/dL
MICROALB UR: 1 mg/dL (ref 0.0–1.9)
Microalb Creat Ratio: 0.6 mg/g (ref 0.0–30.0)

## 2013-10-18 LAB — BASIC METABOLIC PANEL
BUN: 10 mg/dL (ref 6–23)
CHLORIDE: 102 meq/L (ref 96–112)
CO2: 28 mEq/L (ref 19–32)
Calcium: 9.5 mg/dL (ref 8.4–10.5)
Creatinine, Ser: 0.8 mg/dL (ref 0.4–1.5)
GFR: 131.45 mL/min (ref >60.00)
Glucose, Bld: 130 mg/dL — ABNORMAL HIGH (ref 70–99)
POTASSIUM: 4.3 meq/L (ref 3.5–5.1)
SODIUM: 137 meq/L (ref 135–145)

## 2013-10-18 LAB — HEPATIC FUNCTION PANEL
ALK PHOS: 77 U/L (ref 39–117)
ALT: 90 U/L — ABNORMAL HIGH (ref 0–53)
AST: 50 U/L — ABNORMAL HIGH (ref 0–37)
Albumin: 4.2 g/dL (ref 3.5–5.2)
Bilirubin, Direct: 0.2 mg/dL (ref 0.0–0.3)
Total Bilirubin: 0.8 mg/dL (ref 0.2–1.2)
Total Protein: 7.4 g/dL (ref 6.0–8.3)

## 2013-10-18 LAB — HEMOGLOBIN A1C: HEMOGLOBIN A1C: 7.4 % — AB (ref 4.6–6.5)

## 2013-10-18 LAB — PSA: PSA: 0.68 ng/mL (ref 0.10–4.00)

## 2013-10-18 LAB — TSH: TSH: 1.75 u[IU]/mL (ref 0.35–4.50)

## 2013-10-18 MED ORDER — LOSARTAN POTASSIUM-HCTZ 100-25 MG PO TABS: 1.0000 | ORAL_TABLET | Freq: Every day | ORAL | Status: AC

## 2013-10-18 MED ORDER — VERAPAMIL HCL ER 120 MG PO TBCR: 120.0000 mg | EXTENDED_RELEASE_TABLET | Freq: Every day | ORAL | Status: DC

## 2013-10-18 NOTE — Progress Notes (Signed)
Pre visit review using our clinic review tool, if applicable. No additional management support is needed unless otherwise documented below in the visit note. 

## 2013-10-18 NOTE — Progress Notes (Signed)
   Subjective:    Patient ID: Maurice Sweeney, male    DOB: 09/16/1965, 48 y.o.   MRN: [id number]  HPI Here to follow up. He stopped taking his BP meds a few months ago and he cannot explain why. He is willing to get back on them however. He asks me to check a lump on the back which has ben present for several years, but which has been growing larger lately.    Review of Systems  Constitutional: Negative.   Respiratory: Negative.   Cardiovascular: Negative.        Objective:   Physical Exam  Constitutional: He appears well-developed and well-nourished.  Cardiovascular: Normal rate, regular rhythm, normal heart sounds and intact distal pulses.   Pulmonary/Chest: Effort normal and breath sounds normal.  Skin:  There is a large papular lesion on the right upper back which has very heterogenous coloration from dark brown to light tan.           Assessment & Plan:  We will restart his HTN meds. Get fasting labs today. Refer to Dermatology for the skin lesion.

## 2013-10-18 NOTE — Telephone Encounter (Signed)
Relevant patient education mailed to patient.  

## 2013-10-19 ENCOUNTER — Telehealth: Payer: Self-pay | Admitting: Family Medicine

## 2013-10-19 MED ORDER — METFORMIN HCL 500 MG PO TABS: 500.0000 mg | ORAL_TABLET | Freq: Two times a day (BID) | ORAL | Status: DC

## 2013-10-19 NOTE — Telephone Encounter (Signed)
Relevant patient education assigned to patient using Emmi. ° °

## 2013-10-19 NOTE — Addendum Note (Signed)
Addended by: Aniceto BossNIMMONS, Milee Qualls A on: 10/19/2013 01:39 PM   Modules accepted: Orders

## 2013-11-22 ENCOUNTER — Ambulatory Visit: Payer: Managed Care, Other (non HMO) | Admitting: *Deleted

## 2013-12-03 ENCOUNTER — Ambulatory Visit (INDEPENDENT_AMBULATORY_CARE_PROVIDER_SITE_OTHER): Payer: Managed Care, Other (non HMO) | Admitting: *Deleted

## 2013-12-03 VITALS — BP 134/88

## 2013-12-03 DIAGNOSIS — I10 Essential (primary) hypertension: Secondary | ICD-10-CM

## 2013-12-03 NOTE — Progress Notes (Signed)
Pt comes in today for a nurse visit BP check for the diabetic bundle.  He states that he does check his BP at home and it was 166/86.  He takes losartan 100/25 once daily and verapamil 12 mg once daily.  BP today was 152/90.  Had pt sit for a little while and rechecked.  It was 134/88.  Instructed pt at next follow up to bring his cuff to compare.  Currently he does not have follow up scheduled.  Pt verbalized understanding and had no questions

## 2014-01-07 ENCOUNTER — Encounter: Payer: Self-pay | Admitting: Family Medicine

## 2014-01-07 ENCOUNTER — Ambulatory Visit (INDEPENDENT_AMBULATORY_CARE_PROVIDER_SITE_OTHER): Payer: Managed Care, Other (non HMO) | Admitting: Family Medicine

## 2014-01-07 VITALS — BP 132/92 | HR 74 | Temp 97.7°F | Ht 71.25 in | Wt 223.8 lb

## 2014-01-07 DIAGNOSIS — G8929 Other chronic pain: Secondary | ICD-10-CM

## 2014-01-07 DIAGNOSIS — M25561 Pain in right knee: Secondary | ICD-10-CM

## 2014-01-07 NOTE — Patient Instructions (Signed)
-  Contact you orthopedic doctor for treatment

## 2014-01-07 NOTE — Progress Notes (Signed)
No chief complaint on file.   HPI:  Acute visit for:  1) Knee Pain: -R knee pain for many years - injured playing basket ball, has intermittent flares of bilat R>L pain and swelling in knees -reports has seen [place] and [place] [facility] for this - seen as recently a a few months ago per his report and they advised synvisc for bad arthritis but he was not sure he wanted to do this as did not understand what it was -wanted to discuss with PCP, but PCP unavailable today -reports flare the last 3 days -denies: weakness, numbness, fevers, malaise, trauma recently, swelling today  ROS: See pertinent positives and negatives per HPI.  Past Medical History  Diagnosis Date  . Depression   . GERD (gastroesophageal reflux disease)   . Warts, genital   . Allergy   . Hypertension   . Chicken pox     No past surgical history on file.  Family History  Problem Relation Age of Onset  . Alcohol abuse    . Arthritis    . Hypertension    . Stroke      History   Social History  . Marital Status: Divorced    Spouse Name: N/A    Number of Children: N/A  . Years of Education: N/A   Social History Main Topics  . Smoking status: Current Every Day Smoker    Types: Cigarettes  . Smokeless tobacco: Never Used     Comment: 1/2 pack per day  . Alcohol Use: Yes     Comment: rare  . Drug Use: No  . Sexual Activity: None   Other Topics Concern  . None   Social History Narrative  . None    Current outpatient prescriptions:losartan-hydrochlorothiazide (HYZAAR) 100-25 MG per tablet, Take 1 tablet by mouth daily., Disp: 90 tablet, Rfl: 3;  meloxicam (MOBIC) 7.5 MG tablet, Take 7.5 mg by mouth daily., Disp: , Rfl: ;  metFORMIN (GLUCOPHAGE) 500 MG tablet, Take 1 tablet (500 mg total) by mouth 2 (two) times daily with a meal., Disp: 60 tablet, Rfl: 11 verapamil (CALAN-SR) 120 MG CR tablet, Take 1 tablet (120 mg total) by mouth at bedtime., Disp: 90 tablet, Rfl: 3  EXAM:  Filed Vitals:   01/07/14 1059  BP: 132/92  Pulse: 74  Temp: 97.7 F (36.5 C)    Body mass index is 30.99 kg/(m^2).  GENERAL: vitals reviewed and listed above, alert, oriented, appears well hydrated and in no acute distress  HEENT: atraumatic, conjunttiva clear, no obvious abnormalities on inspection of external nose and ears  NECK: no obvious masses on inspection  LUNGS: clear to auscultation bilaterally, no wheezes, rales or rhonchi, good air movement  CV: HRRR, no peripheral edema  MS: moves all extremities without noticeable abnormality Antalgic gait TTP diffusely along jt line R -neg lachman, neg ant/post drawer, no effusion on exam today, neg ballottment NV intact distal  PSYCH: pleasant and cooperative, no obvious depression or anxiety  ASSESSMENT AND PLAN:  Discussed the following assessment and plan:  Chronic knee pain, right  -we discussed possible serious and likely etiologies, workup and treatment, treatment risks and return precautions - 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 Clent RidgesFry as needed -of  course, we advised [place]co  to return or notify a doctor immediately if symptoms worsen or persist or new concerns arise.  .  -Patient advised to return or notify a doctor immediately if symptoms worsen or persist or new concerns arise.  Patient Instructions  -Contact you orthopedic doctor for treatment     Terressa KoyanagiKIM, Tanda Morrissey R.

## 2014-01-07 NOTE — Progress Notes (Signed)
Pre visit review using our clinic review tool, if applicable. No additional management support is needed unless otherwise documented below in the visit note. 

## 2014-05-24 ENCOUNTER — Ambulatory Visit (INDEPENDENT_AMBULATORY_CARE_PROVIDER_SITE_OTHER): Payer: Managed Care, Other (non HMO) | Admitting: Family Medicine

## 2014-05-24 ENCOUNTER — Encounter: Payer: Self-pay | Admitting: Family Medicine

## 2014-05-24 VITALS — BP 160/100 | HR 70 | Temp 98.3°F | Ht 71.25 in | Wt 223.0 lb

## 2014-05-24 DIAGNOSIS — K529 Noninfective gastroenteritis and colitis, unspecified: Secondary | ICD-10-CM

## 2014-05-24 LAB — BASIC METABOLIC PANEL
BUN: 12 mg/dL (ref 6–23)
CALCIUM: 9.6 mg/dL (ref 8.4–10.5)
CO2: 31 meq/L (ref 19–32)
Chloride: 103 mEq/L (ref 96–112)
Creatinine, Ser: 1.05 mg/dL (ref 0.40–1.50)
GFR: 97.18 mL/min (ref >60.00)
GLUCOSE: 173 mg/dL — AB (ref 70–99)
Potassium: 4 mEq/L (ref 3.5–5.1)
Sodium: 137 mEq/L (ref 135–145)

## 2014-05-24 LAB — CBC WITH DIFFERENTIAL/PLATELET
Basophils Absolute: 0 10*3/uL (ref 0.0–0.1)
Basophils Relative: 0.5 % (ref 0.0–3.0)
EOS ABS: 0.2 10*3/uL (ref 0.0–0.7)
Eosinophils Relative: 1.7 % (ref 0.0–5.0)
HCT: 44.8 % (ref 39.0–52.0)
Hemoglobin: 15.3 g/dL (ref 13.0–17.0)
LYMPHS PCT: 20 % (ref 12.0–46.0)
Lymphs Abs: 2.1 10*3/uL (ref 0.7–4.0)
MCHC: 34.1 g/dL (ref 30.0–36.0)
MCV: 91.3 fl (ref 78.0–100.0)
MONO ABS: 0.7 10*3/uL (ref 0.1–1.0)
Monocytes Relative: 6.5 % (ref 3.0–12.0)
NEUTROS PCT: 71.3 % (ref 43.0–77.0)
Neutro Abs: 7.6 10*3/uL (ref 1.4–7.7)
PLATELETS: 200 10*3/uL (ref 150.0–400.0)
RBC: 4.91 Mil/uL (ref 4.22–5.81)
RDW: 12.4 % (ref 11.5–15.5)
WBC: 10.6 10*3/uL — AB (ref 4.0–10.5)

## 2014-05-24 MED ORDER — METRONIDAZOLE 500 MG PO TABS: 500.0000 mg | ORAL_TABLET | Freq: Three times a day (TID) | ORAL | Status: AC

## 2014-05-24 MED ORDER — CIPROFLOXACIN HCL 500 MG PO TABS: 500.0000 mg | ORAL_TABLET | Freq: Two times a day (BID) | ORAL | Status: AC

## 2014-05-24 MED ORDER — METFORMIN HCL 500 MG PO TABS: 500.0000 mg | ORAL_TABLET | Freq: Two times a day (BID) | ORAL | Status: AC

## 2014-05-24 MED ORDER — VERAPAMIL HCL ER 120 MG PO TBCR: 120.0000 mg | EXTENDED_RELEASE_TABLET | Freq: Every day | ORAL | Status: AC

## 2014-05-24 NOTE — Progress Notes (Signed)
   Subjective:    Patient ID: Maurice Sweeney, male    DOB: 10/11/1965, 49 y.o.   MRN: [id number]  HPI Here for 6 days of intermittent lower abdominal cramps, diarrhea, and passing blood from the rectum. No nausea or vomiting, no fever. No urinary symptoms. He had some loose stools several days ago and then no BM until this morning, though the cramps continued. Then this morning he passed bright red blood when he defecated, but there was no stool present. He is drinking fluids but he has not had much of an appetite to eat food.    Review of Systems  Constitutional: Negative for fever, chills and diaphoresis.  Respiratory: Negative.   Cardiovascular: Negative.   Gastrointestinal: Positive for abdominal pain, diarrhea, blood in stool, anal bleeding and rectal pain. Negative for nausea, vomiting and abdominal distention.  Genitourinary: Negative.        Objective:   Physical Exam  Constitutional: He appears well-developed and well-nourished. No distress.  Cardiovascular: Normal rate, regular rhythm, normal heart sounds and intact distal pulses.   Pulmonary/Chest: Effort normal and breath sounds normal.  Abdominal: Soft. Bowel sounds are normal. He exhibits no distension and no mass. There is no rebound and no guarding.  Mildly tender in both lower quadrants           Assessment & Plan:  Enteritis vs. Diverticulitis. Start on Cipro and Flagyl. Get labs today and set up a CT scan soon

## 2014-05-24 NOTE — Progress Notes (Signed)
Pre visit review using our clinic review tool, if applicable. No additional management support is needed unless otherwise documented below in the visit note. 

## 2014-05-26 ENCOUNTER — Inpatient Hospital Stay: Admission: RE | Admit: 2014-05-26 | Payer: Managed Care, Other (non HMO) | Source: Ambulatory Visit

## 2014-05-30 ENCOUNTER — Telehealth: Payer: Self-pay | Admitting: Family Medicine

## 2014-05-30 NOTE — Telephone Encounter (Signed)
Pt called and would like a call  back about CT and LAB RESULT

## 2014-05-31 ENCOUNTER — Other Ambulatory Visit: Payer: Managed Care, Other (non HMO)

## 2014-05-31 NOTE — Telephone Encounter (Signed)
I spoke with pt about lab results, have you reviewed CT results yet?

## 2014-06-01 NOTE — Telephone Encounter (Signed)
Per Dr. Clent RidgesFry the CT scan is normal.  Pt can follow up as needed.  Called and spoke with pt and pt is aware of recommendations. CT sent to scan.

## 2014-06-01 NOTE — Telephone Encounter (Signed)
See my notes on the fax we received

## 2014-06-01 NOTE — Telephone Encounter (Signed)
It looks as if he never had the CT. It is listed in his chart as "ordered" with no result

## 2014-06-01 NOTE — Telephone Encounter (Signed)
Called and spoke with pt and pt states he had his CT at [facility] on Thursday 3.17.2016.  Advised pt I would call to check on results and have Dr. Clent RidgesFry review the results. Called and [facility]a [facility] and results will be faxed over to [phone], Fax received and given to Dr. Clent RidgesFry

## 2014-06-15 ENCOUNTER — Encounter: Payer: Self-pay | Admitting: Family Medicine

## 2014-09-23 ENCOUNTER — Encounter (HOSPITAL_COMMUNITY): Payer: Self-pay

## 2014-09-23 ENCOUNTER — Emergency Department (HOSPITAL_COMMUNITY)
Admission: EM | Admit: 2014-09-23 | Discharge: 2014-09-23 | Disposition: A | Discharge status: Home / Self Care | Payer: Managed Care, Other (non HMO) | Attending: Emergency Medicine | Admitting: Emergency Medicine

## 2014-09-23 DIAGNOSIS — W25XXXA Contact with sharp glass, initial encounter: Secondary | ICD-10-CM | POA: Insufficient documentation

## 2014-09-23 DIAGNOSIS — Z23 Encounter for immunization: Secondary | ICD-10-CM | POA: Diagnosis not present

## 2014-09-23 DIAGNOSIS — F329 Major depressive disorder, single episode, unspecified: Secondary | ICD-10-CM | POA: Insufficient documentation

## 2014-09-23 DIAGNOSIS — Y998 Other external cause status: Secondary | ICD-10-CM | POA: Diagnosis not present

## 2014-09-23 DIAGNOSIS — S61213A Laceration without foreign body of left middle finger without damage to nail, initial encounter: Secondary | ICD-10-CM | POA: Diagnosis not present

## 2014-09-23 DIAGNOSIS — Z8619 Personal history of other infectious and parasitic diseases: Secondary | ICD-10-CM | POA: Diagnosis not present

## 2014-09-23 DIAGNOSIS — I10 Essential (primary) hypertension: Secondary | ICD-10-CM | POA: Diagnosis not present

## 2014-09-23 DIAGNOSIS — Y9289 Other specified places as the place of occurrence of the external cause: Secondary | ICD-10-CM | POA: Diagnosis not present

## 2014-09-23 DIAGNOSIS — Z720 Tobacco use: Secondary | ICD-10-CM | POA: Diagnosis not present

## 2014-09-23 DIAGNOSIS — Z79899 Other long term (current) drug therapy: Secondary | ICD-10-CM | POA: Diagnosis not present

## 2014-09-23 DIAGNOSIS — S61219A Laceration without foreign body of unspecified finger without damage to nail, initial encounter: Secondary | ICD-10-CM

## 2014-09-23 DIAGNOSIS — Y9389 Activity, other specified: Secondary | ICD-10-CM | POA: Insufficient documentation

## 2014-09-23 DIAGNOSIS — Z8719 Personal history of other diseases of the digestive system: Secondary | ICD-10-CM | POA: Diagnosis not present

## 2014-09-23 MED ORDER — TETANUS-DIPHTH-ACELL PERTUSSIS 5-2.5-18.5 LF-MCG/0.5 IM SUSP
0.5000 mL | Freq: Once | INTRAMUSCULAR | Status: AC
  Administered 2014-09-23: 0.5 mL via INTRAMUSCULAR
  Filled 2014-09-23: qty 0.5

## 2014-09-23 MED ORDER — LIDOCAINE HCL 2 % IJ SOLN
5.0000 mL | Freq: Once | INTRAMUSCULAR | Status: AC
  Administered 2014-09-23: 100 mg via INTRADERMAL
  Filled 2014-09-23: qty 20

## 2014-09-23 MED ORDER — TRAMADOL HCL 50 MG PO TABS
50.0000 mg | ORAL_TABLET | Freq: Once | ORAL | Status: AC
  Administered 2014-09-23: 50 mg via ORAL
  Filled 2014-09-23: qty 1

## 2014-09-23 NOTE — ED Provider Notes (Signed)
CSN: [id number]     Arrival date & time 09/23/14  1857 History  This chart was scribed for Maurice Peliffany Glenmore Karl, Maurice Sweeney working with Linwood DibblesJon Knapp, MD by Evon Slackerrance Branch, ED Scribe. This patient was seen in room TR07C/TR07C and the patient's care was started at 7:22 PM.      Chief Complaint  Patient presents with  . Extremity Laceration   The history is provided by the patient. No language interpreter was used.   HPI Comments: Maurice Sweeney is a 49 y.o. male who presents to the Emergency Department complaining of left middle finger laceration onset today at 6 PM. Pt states that he he grabbed a broken glass beer bottle and when he went to toss it out of the way he cut his finger. Pt denies being on blood thinners. Pt does have Hx of DM but states that his sugars are well controlled. Pt denies numbness or tingling.  Bleeding controlled.   Past Medical History  Diagnosis Date  . Depression   . GERD (gastroesophageal reflux disease)   . Warts, genital   . Allergy   . Hypertension   . Chicken pox    History reviewed. No pertinent past surgical history. Family History  Problem Relation Age of Onset  . Alcohol abuse    . Arthritis    . Hypertension    . Stroke     History  Substance Use Topics  . Smoking status: Current Every Day Smoker    Types: Cigarettes  . Smokeless tobacco: Never Used     Comment: 1/2 pack per day  . Alcohol Use: 0.0 oz/week    0 Standard drinks or equivalent per week     Comment: rare    Review of Systems  Skin: Positive for wound.  Neurological: Negative for numbness.  All other systems reviewed and are negative.     Allergies  Review of patient's allergies indicates no known allergies.  Home Medications   Prior to Admission medications   Medication Sig Start Date End Date Taking? Authorizing Provider  ciprofloxacin (CIPRO) 500 MG tablet Take 1 tablet (500 mg total) by mouth 2 (two) times daily. 05/24/14   Nelwyn SalisburyStephen A Fry, MD  losartan-hydrochlorothiazide  (HYZAAR) 100-25 MG per tablet Take 1 tablet by mouth daily. 10/18/13   Nelwyn SalisburyStephen A Fry, MD  meloxicam (MOBIC) 7.5 MG tablet Take 7.5 mg by mouth daily as needed.     Historical Provider, MD  metFORMIN (GLUCOPHAGE) 500 MG tablet Take 1 tablet (500 mg total) by mouth 2 (two) times daily with a meal. 05/24/14   Nelwyn SalisburyStephen A Fry, MD  metroNIDAZOLE (FLAGYL) 500 MG tablet Take 1 tablet (500 mg total) by mouth 3 (three) times daily. 05/24/14   Nelwyn SalisburyStephen A Fry, MD  verapamil (CALAN-SR) 120 MG CR tablet Take 1 tablet (120 mg total) by mouth at bedtime. 05/24/14   Nelwyn SalisburyStephen A Fry, MD   BP 157/95 mmHg  Pulse 62  Temp(Src) 98.1 F (36.7 C) (Oral)  Resp 20  SpO2 100%   Physical Exam  Constitutional: He is oriented to person, place, and time. He appears well-developed and well-nourished. No distress.  HENT:  Head: Normocephalic and atraumatic.  Eyes: Conjunctivae and EOM are normal.  Neck: Neck supple. No tracheal deviation present.  Cardiovascular: Normal rate.   Pulmonary/Chest: Effort normal. No respiratory distress.  Musculoskeletal: Normal range of motion.       Left hand: He exhibits tenderness, laceration (flap laceration that is a 2.5 cm to the ventral middle phalynx -  does not involve the interphalangeal joint) and swelling. He exhibits normal two-point discrimination, normal capillary refill and no deformity. Normal sensation noted. Normal strength noted.       Hands: No joint or bone exposed. Wound thoroughly explored and wound is clean with no foreign bodies.   Neurological: He is alert and oriented to person, place, and time.  Skin: Skin is warm and dry.  Psychiatric: He has a normal mood and affect. His behavior is normal.  Nursing note and vitals reviewed.   ED Course  Procedures (including critical care time) DIAGNOSTIC STUDIES:  COORDINATION OF CARE: 8:03 PM-Discussed treatment plan with pt at bedside and pt agreed to plan.     Labs Review Labs Reviewed - No data to display  Imaging  Review No results found.   EKG Interpretation None      MDM   Final diagnoses:  Finger laceration, initial encounter   LACERATION REPAIR Performed by: Dorthula Matas Authorized by: Dorthula Matas Consent: Verbal consent obtained. Risks and benefits: risks, benefits and alternatives were discussed Consent given by: patient Patient identity confirmed: provided demographic data Prepped and Draped in normal sterile fashion Wound explored  Laceration Location: left middle finger.  Laceration Length: 2.5 cm  No Foreign Bodies seen or palpated  Anesthesia: local infiltration  Local anesthetic: lidocaine 2% wo epinephrine  Anesthetic total: 3 ml  Irrigation method: syringe Amount of cleaning: standard  Skin closure: sutures  Number of sutures: 9  Technique: simple interrupted  Patient tolerance: Patient tolerated the procedure well with no immediate complications.  Education on appropriate wound care given. Sutures to be removed in two weeks.   Medications  lidocaine (XYLOCAINE) 2 % (with pres) injection 100 mg (100 mg Intradermal Given 09/23/14 2013)  Tdap (BOOSTRIX) injection 0.5 mL (0.5 mLs Intramuscular Given 09/23/14 2021)  traMADol (ULTRAM) tablet 50 mg (50 mg Oral Given 09/23/14 2021)    48 y.o.Maurice Sweeney's evaluation in the Emergency Department is complete. It has been determined that no acute conditions requiring further emergency intervention are present at this time. The patient/guardian have been advised of the diagnosis and plan. We have discussed signs and symptoms that warrant return to the ED, such as changes or worsening in symptoms.  Vital signs are stable at discharge. Filed Vitals:   09/23/14 2131  BP: 157/95  Pulse: 62  Temp: 98.1 F (36.7 C)  Resp: 20    Patient/guardian has voiced understanding and agreed to follow-up with the PCP or specialist.  I personally performed the services described in this documentation, which was scribed  in my presence. The recorded information has been reviewed and is accurate.     Maurice Sweeney, Maurice Sweeney 09/23/14 2135  Linwood Dibbles, MD 09/24/14 [phone]

## 2014-09-23 NOTE — Discharge Instructions (Signed)
Laceration Care, Adult °A laceration is a cut or lesion that goes through all layers of the skin and into the tissue just beneath the skin. °TREATMENT  °Some lacerations may not require closure. Some lacerations may not be able to be closed due to an increased risk of infection. It is important to see your caregiver as soon as possible after an injury to minimize the risk of infection and maximize the opportunity for successful closure. °If closure is appropriate, pain medicines may be given, if needed. The wound will be cleaned to help prevent infection. Your caregiver will use stitches (sutures), staples, wound glue (adhesive), or skin adhesive strips to repair the laceration. These tools bring the skin edges together to allow for faster healing and a better cosmetic outcome. However, all wounds will heal with a scar. Once the wound has healed, scarring can be minimized by covering the wound with sunscreen during the day for 1 full year. °HOME CARE INSTRUCTIONS  °For sutures or staples: °· Keep the wound clean and dry. °· If you were given a bandage (dressing), you should change it at least once a day. Also, change the dressing if it becomes wet or dirty, or as directed by your caregiver. °· Wash the wound with soap and water 2 times a day. Rinse the wound off with water to remove all soap. Pat the wound dry with a clean towel. °· After cleaning, apply a thin layer of the antibiotic ointment as recommended by your caregiver. This will help prevent infection and keep the dressing from sticking. °· You may shower as usual after the first 24 hours. Do not soak the wound in water until the sutures are removed. °· Only take over-the-counter or prescription medicines for pain, discomfort, or fever as directed by your caregiver. °· Get your sutures or staples removed as directed by your caregiver. °For skin adhesive strips: °· Keep the wound clean and dry. °· Do not get the skin adhesive strips wet. You may bathe  carefully, using caution to keep the wound dry. °· If the wound gets wet, pat it dry with a clean towel. °· Skin adhesive strips will fall off on their own. You may trim the strips as the wound heals. Do not remove skin adhesive strips that are still stuck to the wound. They will fall off in time. °For wound adhesive: °· You may briefly wet your wound in the shower or bath. Do not soak or scrub the wound. Do not swim. Avoid periods of heavy perspiration until the skin adhesive has fallen off on its own. After showering or bathing, gently pat the wound dry with a clean towel. °· Do not apply liquid medicine, cream medicine, or ointment medicine to your wound while the skin adhesive is in place. This may loosen the film before your wound is healed. °· If a dressing is placed over the wound, be careful not to apply tape directly over the skin adhesive. This may cause the adhesive to be pulled off before the wound is healed. °· Avoid prolonged exposure to sunlight or tanning lamps while the skin adhesive is in place. Exposure to ultraviolet light in the first year will darken the scar. °· The skin adhesive will usually remain in place for 5 to 10 days, then naturally fall off the skin. Do not pick at the adhesive film. °You may need a tetanus shot if: °· You cannot remember when you had your last tetanus shot. °· You have never had a tetanus   shot. °If you get a tetanus shot, your arm may swell, get red, and feel warm to the touch. This is common and not a problem. If you need a tetanus shot and you choose not to have one, there is a rare chance of getting tetanus. Sickness from tetanus can be serious. °SEEK MEDICAL CARE IF:  °· You have redness, swelling, or increasing pain in the wound. °· You see a red line that goes away from the wound. °· You have yellowish-white fluid (pus) coming from the wound. °· You have a fever. °· You notice a bad smell coming from the wound or dressing. °· Your wound breaks open before or  after sutures have been removed. °· You notice something coming out of the wound such as wood or glass. °· Your wound is on your hand or foot and you cannot move a finger or toe. °SEEK IMMEDIATE MEDICAL CARE IF:  °· Your pain is not controlled with prescribed medicine. °· You have severe swelling around the wound causing pain and numbness or a change in color in your arm, hand, leg, or foot. °· Your wound splits open and starts bleeding. °· You have worsening numbness, weakness, or loss of function of any joint around or beyond the wound. °· You develop painful lumps near the wound or on the skin anywhere on your body. °MAKE SURE YOU:  °· Understand these instructions. °· Will watch your condition. °· Will get help right away if you are not doing well or get worse. °Document Released: 02/25/2005 Document Revised: 05/20/2011 Document Reviewed: 08/21/2010 °ExitCare® Patient Information ©2015 [employer]. This information is not intended to replace advice given to you by your health care provider. Make sure you discuss any questions you have with your health care provider. ° °Fingertip Injuries and Amputations °Fingertip injuries are common and often get injured because they are last to escape when pulling your hand out of harm's way. You have amputated (cut off) part of your finger. How this turns out depends largely on how much was amputated. If just the tip is amputated, often the end of the finger will grow back and the finger may return to much the same as it was before the injury.  °If more of the finger is missing, your caregiver has done the best with the tissue remaining to allow you to keep as much finger as is possible. Your caregiver after checking your injury has tried to leave you with a painless fingertip that has durable, feeling skin. If possible, your caregiver has tried to maintain the finger's length and appearance and preserve its fingernail.  °Please read the instructions outlined below and  refer to this sheet in the next few weeks. These instructions provide you with general information on caring for yourself. Your caregiver may also give you specific instructions. While your treatment has been done according to the most current medical practices available, unavoidable complications occasionally occur. If you have any problems or questions after discharge, please call your caregiver. °HOME CARE INSTRUCTIONS  °· You may resume normal diet and activities as directed or allowed. °· Keep your hand elevated above the level of your heart. This helps decrease pain and swelling. °· Keep ice packs (or a bag of ice wrapped in a towel) on the injured area for 15-20 minutes, 03-04 times per day, for the first two days. °· Change dressings if necessary or as directed. °· Clean the wound daily or as directed. °· Only take over-the-counter or prescription   medicines for pain, discomfort, or fever as directed by your caregiver. °· Keep appointments as directed. °SEEK IMMEDIATE MEDICAL CARE IF: °· You develop redness, swelling, numbness or increasing pain in the wound. °· There is pus coming from the wound. °· You develop an unexplained oral temperature above 102° F (38.9° C) or as your caregiver suggests. °· There is a foul (bad) smell coming from the wound or dressing. °· There is a breaking open of the wound (edges not staying together) after sutures or staples have been removed. °MAKE SURE YOU:  °· Understand these instructions. °· Will watch your condition. °· Will get help right away if you are not doing well or get worse. °Document Released: 01/16/2005 Document Revised: 05/20/2011 Document Reviewed: 12/16/2007 °ExitCare® Patient Information ©2015 [employer]. This information is not intended to replace advice given to you by your health care provider. Make sure you discuss any questions you have with your health care provider. ° °

## 2014-09-23 NOTE — ED Notes (Signed)
Pt states he picked up a bottle from his yard and it cut his finger.
